# Patient Record
Sex: Female | Born: 1955 | Race: White | Hispanic: No | Marital: Married | Smoking: Current every day smoker
Health system: Southern US, Community
[De-identification: ages and names within clinical notes are randomized; demographics above are authoritative.]

## PROBLEM LIST (undated history)

## (undated) DIAGNOSIS — H9319 Tinnitus, unspecified ear: Secondary | ICD-10-CM

## (undated) DIAGNOSIS — R42 Dizziness and giddiness: Secondary | ICD-10-CM

## (undated) HISTORY — DX: Tinnitus, unspecified ear: H93.19

## (undated) HISTORY — DX: Dizziness and giddiness: R42

---

## 2011-11-25 ENCOUNTER — Ambulatory Visit (INDEPENDENT_AMBULATORY_CARE_PROVIDER_SITE_OTHER): Payer: 59 | Admitting: Physician Assistant

## 2011-11-25 VITALS — BP 100/68 | HR 76 | Temp 98.4°F | Resp 16 | Ht 62.5 in | Wt 143.2 lb

## 2011-11-25 DIAGNOSIS — R350 Frequency of micturition: Secondary | ICD-10-CM

## 2011-11-25 LAB — POCT URINALYSIS DIPSTICK
Blood, UA: NEGATIVE
Glucose, UA: NEGATIVE
Nitrite, UA: NEGATIVE
Urobilinogen, UA: 0.2

## 2011-11-25 LAB — POCT UA - MICROSCOPIC ONLY
Bacteria, U Microscopic: NEGATIVE
Casts, Ur, LPF, POC: NEGATIVE
Epithelial cells, urine per micros: NEGATIVE
Yeast, UA: NEGATIVE

## 2011-11-25 MED ORDER — CIPROFLOXACIN HCL 500 MG PO TABS: 500.0000 mg | ORAL_TABLET | Freq: Two times a day (BID) | ORAL | Status: AC
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
## 2011-11-27 LAB — URINE CULTURE: Colony Count: 8000

## 2011-11-28 ENCOUNTER — Telehealth: Payer: Self-pay

## 2011-11-28 NOTE — Telephone Encounter (Signed)
Please contact pt about her lab.Marland Kitchen

## 2011-11-28 NOTE — Telephone Encounter (Signed)
See labs 

## 2012-01-25 ENCOUNTER — Other Ambulatory Visit (HOSPITAL_COMMUNITY)
Admission: RE | Admit: 2012-01-25 | Discharge: 2012-01-25 | Disposition: A | Discharge status: Home / Self Care | Payer: 59 | Source: Ambulatory Visit | Attending: Family Medicine | Admitting: Family Medicine

## 2012-01-25 DIAGNOSIS — Z0000 Encounter for general adult medical examination without abnormal findings: Secondary | ICD-10-CM | POA: Insufficient documentation

## 2012-11-13 ENCOUNTER — Other Ambulatory Visit: Payer: Self-pay

## 2012-11-13 DIAGNOSIS — Z1231 Encounter for screening mammogram for malignant neoplasm of breast: Secondary | ICD-10-CM

## 2012-12-05 ENCOUNTER — Ambulatory Visit
Admission: RE | Admit: 2012-12-05 | Discharge: 2012-12-05 | Disposition: A | Discharge status: Home / Self Care | Payer: 59 | Source: Ambulatory Visit

## 2012-12-05 DIAGNOSIS — Z1231 Encounter for screening mammogram for malignant neoplasm of breast: Secondary | ICD-10-CM

## 2014-01-22 ENCOUNTER — Other Ambulatory Visit: Payer: Self-pay

## 2014-01-22 DIAGNOSIS — Z1231 Encounter for screening mammogram for malignant neoplasm of breast: Secondary | ICD-10-CM

## 2014-01-30 ENCOUNTER — Ambulatory Visit
Admission: RE | Admit: 2014-01-30 | Discharge: 2014-01-30 | Disposition: A | Discharge status: Home / Self Care | Payer: 59 | Source: Ambulatory Visit

## 2014-01-30 DIAGNOSIS — Z1231 Encounter for screening mammogram for malignant neoplasm of breast: Secondary | ICD-10-CM

## 2015-07-01 ENCOUNTER — Other Ambulatory Visit: Payer: Self-pay | Admitting: Family Medicine

## 2015-07-01 DIAGNOSIS — G4489 Other headache syndrome: Secondary | ICD-10-CM

## 2015-07-14 ENCOUNTER — Ambulatory Visit
Admission: RE | Admit: 2015-07-14 | Discharge: 2015-07-14 | Disposition: A | Discharge status: Home / Self Care | Payer: Self-pay | Source: Ambulatory Visit | Attending: Family Medicine | Admitting: Family Medicine

## 2015-07-14 DIAGNOSIS — G4489 Other headache syndrome: Secondary | ICD-10-CM

## 2015-07-14 MED ORDER — GADOBENATE DIMEGLUMINE 529 MG/ML IV SOLN
14.0000 mL | Freq: Once | INTRAVENOUS | Status: AC | PRN
  Administered 2015-07-14: 14 mL via INTRAVENOUS

## 2016-05-31 DIAGNOSIS — R05 Cough: Secondary | ICD-10-CM | POA: Diagnosis not present

## 2016-05-31 DIAGNOSIS — R112 Nausea with vomiting, unspecified: Secondary | ICD-10-CM | POA: Diagnosis not present

## 2016-05-31 DIAGNOSIS — R197 Diarrhea, unspecified: Secondary | ICD-10-CM | POA: Diagnosis not present

## 2016-06-20 ENCOUNTER — Encounter: Payer: Self-pay | Admitting: Pediatrics

## 2016-06-20 ENCOUNTER — Ambulatory Visit (INDEPENDENT_AMBULATORY_CARE_PROVIDER_SITE_OTHER): Payer: 59 | Admitting: Pediatrics

## 2016-06-20 VITALS — BP 118/66 | HR 69 | Temp 98.0°F | Resp 16 | Ht 62.5 in | Wt 143.0 lb

## 2016-06-20 DIAGNOSIS — J4521 Mild intermittent asthma with (acute) exacerbation: Secondary | ICD-10-CM

## 2016-06-20 DIAGNOSIS — B9789 Other viral agents as the cause of diseases classified elsewhere: Secondary | ICD-10-CM

## 2016-06-20 DIAGNOSIS — J069 Acute upper respiratory infection, unspecified: Secondary | ICD-10-CM | POA: Insufficient documentation

## 2016-06-20 DIAGNOSIS — J4520 Mild intermittent asthma, uncomplicated: Secondary | ICD-10-CM | POA: Insufficient documentation

## 2016-06-20 DIAGNOSIS — J3089 Other allergic rhinitis: Secondary | ICD-10-CM | POA: Insufficient documentation

## 2016-06-20 MED ORDER — ALBUTEROL SULFATE HFA 108 (90 BASE) MCG/ACT IN AERS: 2.0000 | INHALATION_SPRAY | RESPIRATORY_TRACT | 2 refills | Status: AC | PRN

## 2016-06-20 NOTE — Progress Notes (Signed)
  [address] [place] [address] Dept: [phone]  FOLLOW UP NOTE  Patient ID: Kari Doyle, female    DOB: [birth year]  Age: 61 y.o. MRN: [id number] Date of Office Visit: 06/20/2016  Assessment  Chief Complaint: Asthma  HPI Kari Simmerslicia Anne Canale presents for follow-up of a asthma and allergic rhinitis. We had not seen her since October 2015. Her asthma has been well controlled without preventative medications. She developed a respiratory infection in February 10, and  began to have coughing and wheezing. She had a low-grade fever for a day or 2. She has continued to cough. She began to use Ventolin 2 puffs every 4 hours if needed.. She did not get a flu shot last fall. If she eats peanut butter she gets abdominal gas only.  Current medications are Zyrtec 10 mg once a day if needed for runny nose and Ventolin 2 puffs every 4 hours if needed for wheezing or coughing spells. Her other medications are outlined in the chart   Drug Allergies:  Allergies  Allergen Reactions  . Aspirin   . Nsaids     Physical Exam: BP 118/66   Pulse 69   Temp 98 F (36.7 C) (Oral)   Resp 16   Ht 5' 2.5" (1.588 m)   Wt 143 lb (64.9 kg)   SpO2 96%   BMI 25.74 kg/m    Physical Exam  Constitutional: She appears well-developed and well-nourished.  HENT:  Eyes normal. Ears normal. Nose normal. Pharynx normal.  Neck: Neck supple.  Cardiovascular:  S1 and S2 normal no murmurs  Pulmonary/Chest:  Clear to percussion auscultation except for mild  wheezing on end expiration  Lymphadenopathy:    She has no cervical adenopathy.  Neurological: She is alert.  Psychiatric: She has a normal mood and affect. Her behavior is normal. Judgment and thought content normal.  Vitals reviewed.   Diagnostics: FVC 2.36 L FEV1 2.03 L. Predicted FVC 2.99 L predicted FEV1 2.30 L-the spirometry is in the normal range   Assessment and Plan: 1. Mild intermittent asthma with acute exacerbation   2. Other  allergic rhinitis   3. Viral upper respiratory tract infection with cough     Meds ordered this encounter  Medications  . albuterol (PROVENTIL HFA;VENTOLIN HFA) 108 (90 Base) MCG/ACT inhaler    Sig: Inhale 2 puffs into the lungs every 4 (four) hours as needed.    Dispense:  1 Inhaler    Refill:  2    Patient Instructions  Ventolin 2 puffs every 4 hours if needed for wheezing or coughing spells Add prednisone 20 mg twice a day for 3 days, 20 mg on the fourth day, 10 mg on the fifth day to bring  the asthma under control. She should have a flu shot this fall She will call me if she's not doing well on this treatment plan   Return in about 1 year (around 06/20/2017).    Thank you for the opportunity to care for this patient.  Please do not hesitate to contact me with questions.  Tonette BihariJ. A. Bardelas, M.D.  [facility]  [address] [place], [place] [address] [phone]

## 2016-06-20 NOTE — Patient Instructions (Signed)
Ventolin 2 puffs every 4 hours if needed for wheezing or coughing spells Add prednisone 20 mg twice a day for 3 days, 20 mg on the fourth day, 10 mg on the fifth day to bring  the asthma under control. She should have a flu shot this fall She will call me if she's not doing well on this treatment plan

## 2016-06-28 ENCOUNTER — Ambulatory Visit (INDEPENDENT_AMBULATORY_CARE_PROVIDER_SITE_OTHER): Payer: 59 | Admitting: Pediatrics

## 2016-06-28 ENCOUNTER — Encounter: Payer: Self-pay | Admitting: Pediatrics

## 2016-06-28 VITALS — BP 92/64 | HR 70 | Temp 98.3°F | Resp 16

## 2016-06-28 DIAGNOSIS — J4520 Mild intermittent asthma, uncomplicated: Secondary | ICD-10-CM

## 2016-06-28 DIAGNOSIS — L503 Dermatographic urticaria: Secondary | ICD-10-CM | POA: Diagnosis not present

## 2016-06-28 DIAGNOSIS — J3089 Other allergic rhinitis: Secondary | ICD-10-CM

## 2016-06-28 NOTE — Patient Instructions (Addendum)
Environmental control of dust and mold Zyrtec 10 mg once a day if needed for runny nose or itching Fluticasone 2 sprays per nostril once a day if needed for stuffy nose Ventolin 2 puffs every 4 hours if needed for wheezing or coughing spells She should have a flu shot this fall Keep the dog out of her bedroom Continue avoiding aspirin, ibuprofen and related compounds The foods that she has been avoiding, may have been at a time when she had very significant dermographia. The skin testing to these foods was negative. She  may add these foods individually to see if they still make her itch  Call me if you're not doing well on this treatment plan

## 2016-06-28 NOTE — Progress Notes (Signed)
  [address] [place] [address] Dept: [phone]  FOLLOW UP NOTE  Patient ID: Kari Doyle Anne Doyle, female    DOB: 1955-08-20  Age: 61 y.o. MRN: [id number] Date of Office Visit: 06/28/2016  Assessment  Chief Complaint: Allergy Testing  HPI Kari Doyle Anne Pellum presents for allergy testing. She had an exacerbation of asthma in  February associated with a  respiratory tract infection . 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 She has had itching in the past from strawberries, apples, peaches, vinegar. At times she does have nasal congestion. She avoids aspirin ibuprofen and related compounds. She has a history of thyroid antibodies when she had dermographia. If she eats a great deal of peanut butter she has some gas , but no allergic symptoms   Drug Allergies:  Allergies  Allergen Reactions  . Aspirin   . Nsaids     Physical Exam: BP 92/64   Pulse 70   Temp 98.3 F (36.8 C) (Oral)   Resp 16   SpO2 99%    Physical Exam  Constitutional: She is oriented to person, place, and time. She appears well-developed and well-nourished.  HENT:  Eyes normal. Ears normal. Nose normal. Pharynx normal.  Neck: Neck supple.  Cardiovascular:  S1 and S2 normal no murmurs  Pulmonary/Chest:  Clear to percussion and auscultation  Lymphadenopathy:    She has no cervical adenopathy.  Neurological: She is alert and oriented to person, place, and time.  Psychiatric: She has a normal mood and affect. Her behavior is normal. Judgment and thought content normal.  Vitals reviewed.   Diagnostics:  Add allergy skin testing showed positive skin test to a common indoor mold. Skin testing to dog showed minimal reactivity on intradermal testing only. Skin testing to foods that she has been avoiding in the past was negative  FVC 2.56 L FEV1 2.06 L. Predicted FVC 3.13 L predicted FEV1 2.41 L-the spirometry is in the normal range  Assessment and Plan: 1. Other allergic rhinitis   2. Mild  intermittent asthma without complication   3. Dermographia        Patient Instructions  Environmental control of dust and mold Zyrtec 10 mg once a day if needed for runny nose or itching Fluticasone 2 sprays per nostril once a day if needed for stuffy nose Ventolin 2 puffs every 4 hours if needed for wheezing or coughing spells She should have a flu shot this fall Keep the dog out of her bedroom Continue avoiding aspirin, ibuprofen and related compounds The foods that she has been avoiding, may have been at a time when she had very significant dermographia. The skin testing to these foods was negative. She  may add these foods individually to see if they still make her itch  Call me if you're not doing well on this treatment plan   Return in about 6 months (around 12/26/2016).    Thank you for the opportunity to care for this patient.  Please do not hesitate to contact me with questions.  Tonette BihariJ. A. Tiauna Whisnant, M.D.  [facility] [address] [place], [place] [address] [phone]

## 2016-07-04 NOTE — Addendum Note (Signed)
Addended by: Maryjean MornFREEMAN, LOGAN D on: 07/04/2016 02:03 PM   Modules accepted: Orders

## 2017-01-10 DIAGNOSIS — Z23 Encounter for immunization: Secondary | ICD-10-CM | POA: Diagnosis not present

## 2017-01-10 DIAGNOSIS — H9311 Tinnitus, right ear: Secondary | ICD-10-CM | POA: Diagnosis not present

## 2017-01-27 DIAGNOSIS — H9311 Tinnitus, right ear: Secondary | ICD-10-CM | POA: Diagnosis not present

## 2017-01-27 DIAGNOSIS — J343 Hypertrophy of nasal turbinates: Secondary | ICD-10-CM | POA: Diagnosis not present

## 2017-01-27 DIAGNOSIS — H903 Sensorineural hearing loss, bilateral: Secondary | ICD-10-CM | POA: Diagnosis not present

## 2017-01-27 DIAGNOSIS — H6981 Other specified disorders of Eustachian tube, right ear: Secondary | ICD-10-CM | POA: Diagnosis not present

## 2017-07-26 ENCOUNTER — Other Ambulatory Visit: Payer: Self-pay | Admitting: Family Medicine

## 2017-07-26 ENCOUNTER — Other Ambulatory Visit (HOSPITAL_COMMUNITY)
Admission: RE | Admit: 2017-07-26 | Discharge: 2017-07-26 | Disposition: A | Discharge status: Home / Self Care | Payer: 59 | Source: Ambulatory Visit | Attending: Family Medicine | Admitting: Family Medicine

## 2017-07-26 DIAGNOSIS — R1111 Vomiting without nausea: Secondary | ICD-10-CM | POA: Diagnosis not present

## 2017-07-26 DIAGNOSIS — E785 Hyperlipidemia, unspecified: Secondary | ICD-10-CM | POA: Diagnosis not present

## 2017-07-26 DIAGNOSIS — Z0000 Encounter for general adult medical examination without abnormal findings: Secondary | ICD-10-CM | POA: Diagnosis not present

## 2017-07-26 DIAGNOSIS — Z124 Encounter for screening for malignant neoplasm of cervix: Secondary | ICD-10-CM | POA: Diagnosis present

## 2017-07-26 DIAGNOSIS — R350 Frequency of micturition: Secondary | ICD-10-CM | POA: Diagnosis not present

## 2017-07-28 LAB — CYTOLOGY - PAP
Diagnosis: NEGATIVE
HPV: NOT DETECTED

## 2017-08-09 ENCOUNTER — Other Ambulatory Visit: Payer: Self-pay | Admitting: Gastroenterology

## 2017-08-09 DIAGNOSIS — R1110 Vomiting, unspecified: Secondary | ICD-10-CM

## 2017-08-09 DIAGNOSIS — R198 Other specified symptoms and signs involving the digestive system and abdomen: Secondary | ICD-10-CM | POA: Diagnosis not present

## 2017-08-14 ENCOUNTER — Ambulatory Visit
Admission: RE | Admit: 2017-08-14 | Discharge: 2017-08-14 | Disposition: A | Discharge status: Home / Self Care | Payer: 59 | Source: Ambulatory Visit | Attending: Gastroenterology | Admitting: Gastroenterology

## 2017-08-14 ENCOUNTER — Other Ambulatory Visit: Payer: Self-pay | Admitting: Gastroenterology

## 2017-08-14 DIAGNOSIS — R1110 Vomiting, unspecified: Secondary | ICD-10-CM

## 2017-08-21 ENCOUNTER — Other Ambulatory Visit: Payer: Self-pay | Admitting: Family Medicine

## 2017-08-21 DIAGNOSIS — Z139 Encounter for screening, unspecified: Secondary | ICD-10-CM

## 2017-08-23 ENCOUNTER — Ambulatory Visit
Admission: RE | Admit: 2017-08-23 | Discharge: 2017-08-23 | Disposition: A | Discharge status: Home / Self Care | Payer: 59 | Source: Ambulatory Visit | Attending: Gastroenterology | Admitting: Gastroenterology

## 2017-08-23 DIAGNOSIS — K449 Diaphragmatic hernia without obstruction or gangrene: Secondary | ICD-10-CM | POA: Diagnosis not present

## 2017-08-23 DIAGNOSIS — R1110 Vomiting, unspecified: Secondary | ICD-10-CM

## 2017-08-25 ENCOUNTER — Other Ambulatory Visit: Payer: Self-pay | Admitting: Gastroenterology

## 2017-08-25 DIAGNOSIS — R933 Abnormal findings on diagnostic imaging of other parts of digestive tract: Secondary | ICD-10-CM

## 2017-09-05 ENCOUNTER — Ambulatory Visit
Admission: RE | Admit: 2017-09-05 | Discharge: 2017-09-05 | Disposition: A | Discharge status: Home / Self Care | Payer: 59 | Source: Ambulatory Visit | Attending: Gastroenterology | Admitting: Gastroenterology

## 2017-09-05 DIAGNOSIS — R933 Abnormal findings on diagnostic imaging of other parts of digestive tract: Secondary | ICD-10-CM

## 2017-09-05 DIAGNOSIS — K7689 Other specified diseases of liver: Secondary | ICD-10-CM | POA: Diagnosis not present

## 2017-09-12 DIAGNOSIS — K449 Diaphragmatic hernia without obstruction or gangrene: Secondary | ICD-10-CM | POA: Diagnosis not present

## 2017-09-12 DIAGNOSIS — K760 Fatty (change of) liver, not elsewhere classified: Secondary | ICD-10-CM | POA: Diagnosis not present

## 2017-09-13 DIAGNOSIS — M8588 Other specified disorders of bone density and structure, other site: Secondary | ICD-10-CM | POA: Diagnosis not present

## 2017-09-19 ENCOUNTER — Ambulatory Visit
Admission: RE | Admit: 2017-09-19 | Discharge: 2017-09-19 | Disposition: A | Discharge status: Home / Self Care | Payer: 59 | Source: Ambulatory Visit | Attending: Family Medicine | Admitting: Family Medicine

## 2017-09-19 DIAGNOSIS — Z139 Encounter for screening, unspecified: Secondary | ICD-10-CM

## 2017-09-19 DIAGNOSIS — Z1231 Encounter for screening mammogram for malignant neoplasm of breast: Secondary | ICD-10-CM | POA: Diagnosis not present

## 2017-09-20 ENCOUNTER — Other Ambulatory Visit: Payer: Self-pay | Admitting: Family Medicine

## 2017-09-20 DIAGNOSIS — R921 Mammographic calcification found on diagnostic imaging of breast: Secondary | ICD-10-CM

## 2017-09-25 ENCOUNTER — Other Ambulatory Visit: Payer: Self-pay | Admitting: Family Medicine

## 2017-09-25 ENCOUNTER — Ambulatory Visit
Admission: RE | Admit: 2017-09-25 | Discharge: 2017-09-25 | Disposition: A | Discharge status: Home / Self Care | Payer: 59 | Source: Ambulatory Visit | Attending: Family Medicine | Admitting: Family Medicine

## 2017-09-25 DIAGNOSIS — R921 Mammographic calcification found on diagnostic imaging of breast: Secondary | ICD-10-CM

## 2017-09-25 DIAGNOSIS — R928 Other abnormal and inconclusive findings on diagnostic imaging of breast: Secondary | ICD-10-CM | POA: Diagnosis not present

## 2017-09-27 ENCOUNTER — Ambulatory Visit
Admission: RE | Admit: 2017-09-27 | Discharge: 2017-09-27 | Disposition: A | Discharge status: Home / Self Care | Payer: 59 | Source: Ambulatory Visit | Attending: Family Medicine | Admitting: Family Medicine

## 2017-09-27 DIAGNOSIS — N6011 Diffuse cystic mastopathy of right breast: Secondary | ICD-10-CM | POA: Diagnosis not present

## 2017-09-27 DIAGNOSIS — R921 Mammographic calcification found on diagnostic imaging of breast: Secondary | ICD-10-CM

## 2017-09-28 DIAGNOSIS — R1084 Generalized abdominal pain: Secondary | ICD-10-CM | POA: Diagnosis not present

## 2018-01-29 ENCOUNTER — Ambulatory Visit
Admission: RE | Admit: 2018-01-29 | Discharge: 2018-01-29 | Disposition: A | Discharge status: Home / Self Care | Payer: 59 | Source: Ambulatory Visit | Attending: Family Medicine | Admitting: Family Medicine

## 2018-01-29 ENCOUNTER — Other Ambulatory Visit: Payer: Self-pay | Admitting: Family Medicine

## 2018-01-29 DIAGNOSIS — R0789 Other chest pain: Secondary | ICD-10-CM | POA: Diagnosis not present

## 2018-01-29 DIAGNOSIS — Z23 Encounter for immunization: Secondary | ICD-10-CM | POA: Diagnosis not present

## 2018-01-29 DIAGNOSIS — R079 Chest pain, unspecified: Secondary | ICD-10-CM | POA: Diagnosis not present

## 2018-01-29 DIAGNOSIS — R52 Pain, unspecified: Secondary | ICD-10-CM

## 2018-02-28 DIAGNOSIS — R0781 Pleurodynia: Secondary | ICD-10-CM | POA: Diagnosis not present

## 2018-02-28 DIAGNOSIS — F17200 Nicotine dependence, unspecified, uncomplicated: Secondary | ICD-10-CM | POA: Diagnosis not present

## 2018-07-03 ENCOUNTER — Ambulatory Visit
Admission: RE | Admit: 2018-07-03 | Discharge: 2018-07-03 | Disposition: A | Discharge status: Home / Self Care | Payer: 59 | Source: Ambulatory Visit | Attending: Family Medicine | Admitting: Family Medicine

## 2018-07-03 ENCOUNTER — Other Ambulatory Visit: Payer: Self-pay | Admitting: Family Medicine

## 2018-07-03 DIAGNOSIS — R0789 Other chest pain: Secondary | ICD-10-CM | POA: Diagnosis not present

## 2018-07-03 DIAGNOSIS — R52 Pain, unspecified: Secondary | ICD-10-CM

## 2018-07-03 DIAGNOSIS — F17200 Nicotine dependence, unspecified, uncomplicated: Secondary | ICD-10-CM | POA: Diagnosis not present

## 2018-08-02 DIAGNOSIS — J341 Cyst and mucocele of nose and nasal sinus: Secondary | ICD-10-CM | POA: Diagnosis not present

## 2018-08-02 DIAGNOSIS — G44219 Episodic tension-type headache, not intractable: Secondary | ICD-10-CM | POA: Diagnosis not present

## 2018-08-02 DIAGNOSIS — H9201 Otalgia, right ear: Secondary | ICD-10-CM | POA: Diagnosis not present

## 2018-11-02 ENCOUNTER — Ambulatory Visit: Payer: 59

## 2018-11-02 ENCOUNTER — Other Ambulatory Visit: Payer: Self-pay | Admitting: Family Medicine

## 2018-11-02 DIAGNOSIS — Z1231 Encounter for screening mammogram for malignant neoplasm of breast: Secondary | ICD-10-CM

## 2018-11-05 ENCOUNTER — Other Ambulatory Visit: Payer: Self-pay

## 2018-11-05 ENCOUNTER — Ambulatory Visit
Admission: RE | Admit: 2018-11-05 | Discharge: 2018-11-05 | Disposition: A | Discharge status: Home / Self Care | Payer: 59 | Source: Ambulatory Visit | Attending: Family Medicine | Admitting: Family Medicine

## 2018-11-05 DIAGNOSIS — Z1231 Encounter for screening mammogram for malignant neoplasm of breast: Secondary | ICD-10-CM

## 2019-03-11 ENCOUNTER — Other Ambulatory Visit: Payer: Self-pay | Admitting: Family Medicine

## 2019-03-11 DIAGNOSIS — R634 Abnormal weight loss: Secondary | ICD-10-CM

## 2019-03-27 ENCOUNTER — Ambulatory Visit
Admission: RE | Admit: 2019-03-27 | Discharge: 2019-03-27 | Disposition: A | Discharge status: Home / Self Care | Payer: Medicare Other | Source: Ambulatory Visit | Attending: Family Medicine | Admitting: Family Medicine

## 2019-03-27 ENCOUNTER — Other Ambulatory Visit: Payer: Self-pay

## 2019-03-27 ENCOUNTER — Ambulatory Visit
Admission: RE | Admit: 2019-03-27 | Discharge: 2019-03-27 | Disposition: A | Discharge status: Home / Self Care | Payer: Self-pay | Source: Ambulatory Visit | Attending: Family Medicine | Admitting: Family Medicine

## 2019-03-27 DIAGNOSIS — R634 Abnormal weight loss: Secondary | ICD-10-CM

## 2019-03-27 MED ORDER — IOPAMIDOL (ISOVUE-300) INJECTION 61%
100.0000 mL | Freq: Once | INTRAVENOUS | Status: AC | PRN
  Administered 2019-03-27: 100 mL via INTRAVENOUS

## 2019-04-12 IMAGING — CR DG CHEST 2V
2 series · 2 of 2 positions shown · non-contrast
Comparison: None.

CLINICAL DATA: Chest pain for 2 months, RIGHT greater than LEFT.

EXAM:
CHEST - 2 VIEW

[w chest pa]
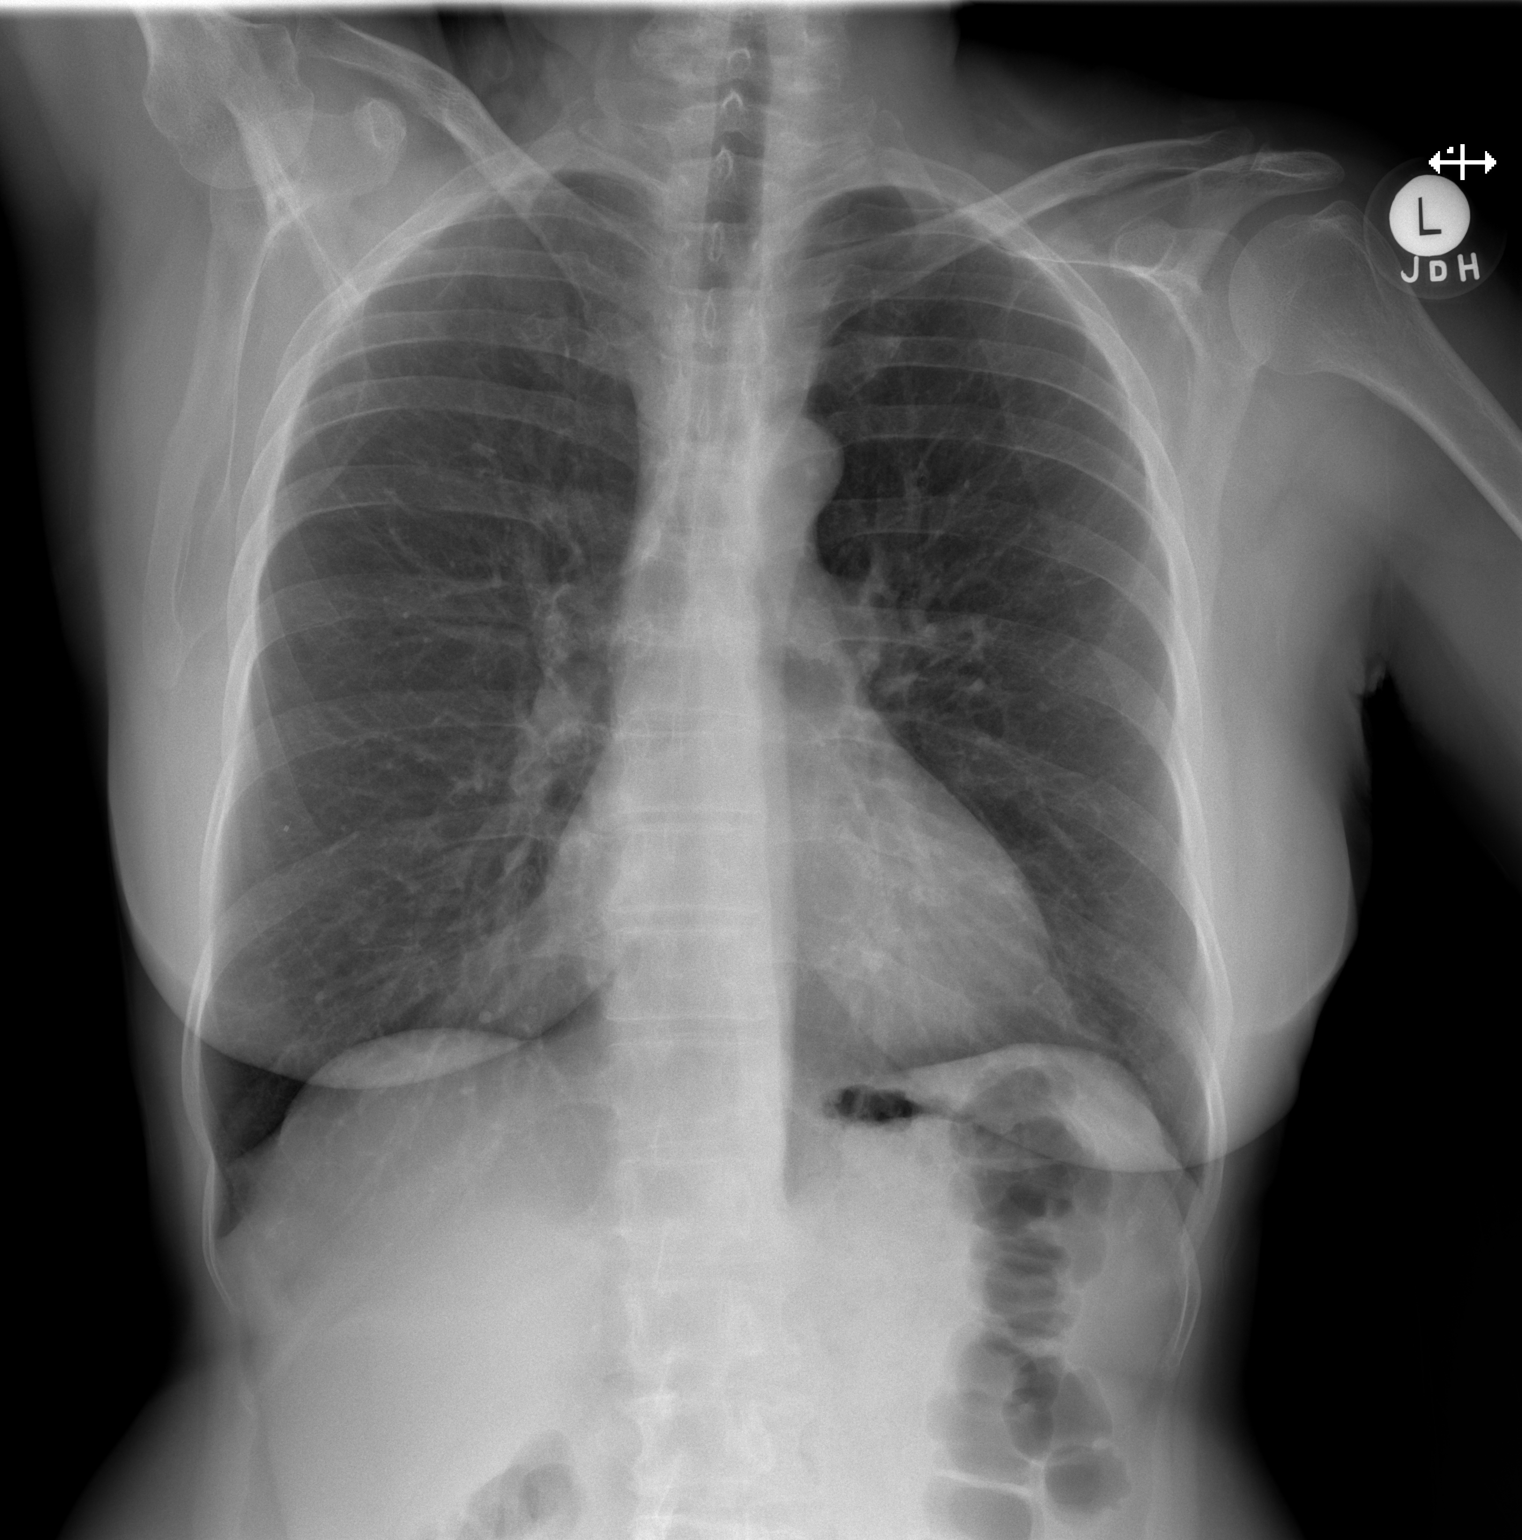

[w chest lat]
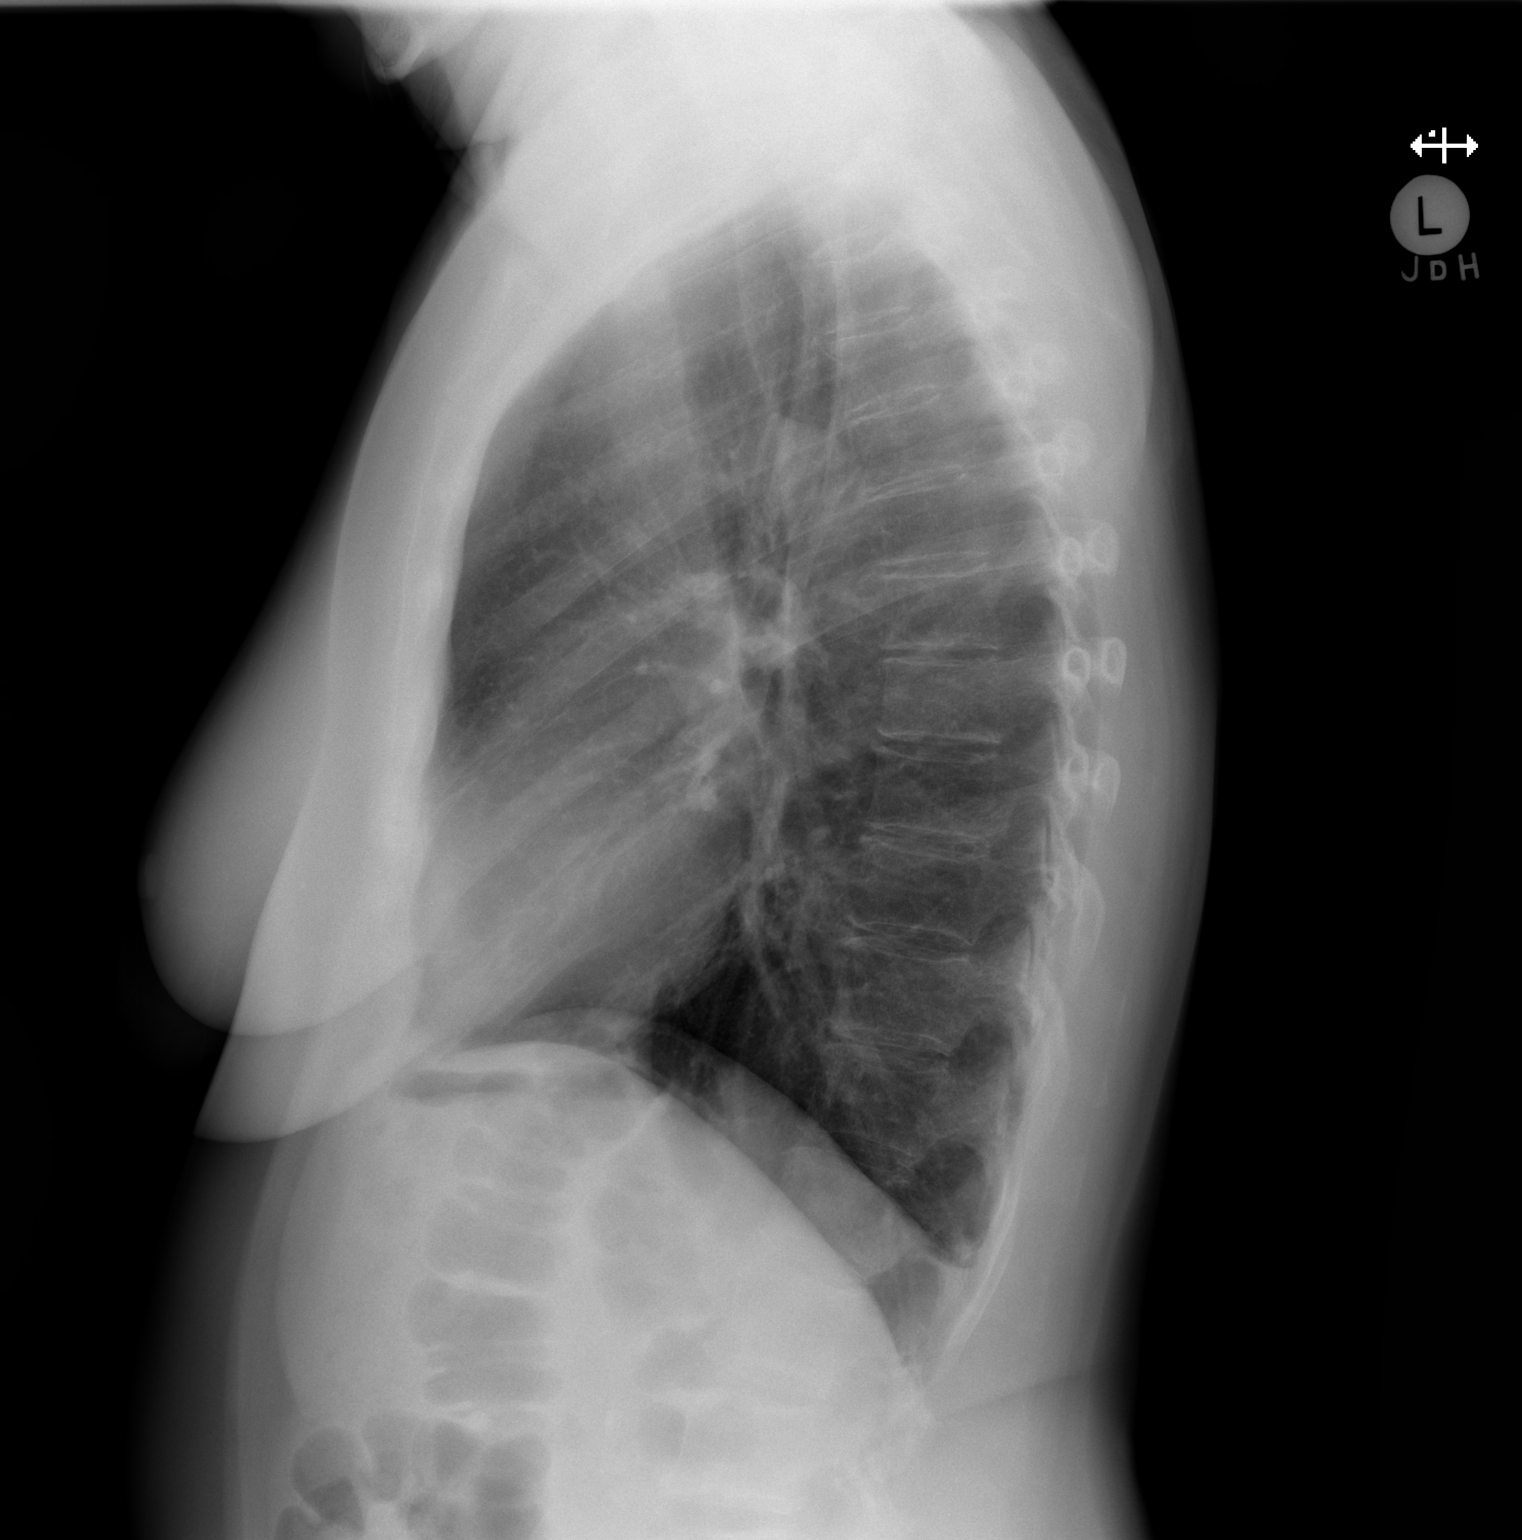

[2 of 2 positions shown; findings below may reference images not displayed]

FINDINGS: Heart size and mediastinal contours are within normal limits. Lungs
are clear. No pleural effusion or pneumothorax seen. Osseous
structures about the chest are unremarkable.
IMPRESSION: No active cardiopulmonary disease.

## 2019-08-30 ENCOUNTER — Other Ambulatory Visit: Payer: Self-pay | Admitting: Family Medicine

## 2019-08-30 DIAGNOSIS — Z1231 Encounter for screening mammogram for malignant neoplasm of breast: Secondary | ICD-10-CM

## 2019-08-30 DIAGNOSIS — M8580 Other specified disorders of bone density and structure, unspecified site: Secondary | ICD-10-CM

## 2020-01-24 ENCOUNTER — Other Ambulatory Visit: Payer: Self-pay

## 2020-01-24 ENCOUNTER — Ambulatory Visit
Admission: RE | Admit: 2020-01-24 | Discharge: 2020-01-24 | Disposition: A | Discharge status: Home / Self Care | Payer: Medicare Other | Source: Ambulatory Visit | Attending: Family Medicine | Admitting: Family Medicine

## 2020-01-24 DIAGNOSIS — Z1231 Encounter for screening mammogram for malignant neoplasm of breast: Secondary | ICD-10-CM

## 2020-01-29 ENCOUNTER — Other Ambulatory Visit: Payer: Self-pay | Admitting: Family Medicine

## 2020-01-29 DIAGNOSIS — R928 Other abnormal and inconclusive findings on diagnostic imaging of breast: Secondary | ICD-10-CM

## 2020-02-11 ENCOUNTER — Ambulatory Visit: Payer: Medicare Other

## 2020-02-11 ENCOUNTER — Other Ambulatory Visit: Payer: Self-pay

## 2020-02-11 ENCOUNTER — Ambulatory Visit
Admission: RE | Admit: 2020-02-11 | Discharge: 2020-02-11 | Disposition: A | Discharge status: Home / Self Care | Payer: Medicare Other | Source: Ambulatory Visit | Attending: Family Medicine | Admitting: Family Medicine

## 2020-02-11 DIAGNOSIS — R928 Other abnormal and inconclusive findings on diagnostic imaging of breast: Secondary | ICD-10-CM

## 2020-03-30 ENCOUNTER — Other Ambulatory Visit: Payer: Self-pay | Admitting: Family Medicine

## 2020-03-30 DIAGNOSIS — R911 Solitary pulmonary nodule: Secondary | ICD-10-CM

## 2020-04-10 ENCOUNTER — Other Ambulatory Visit: Payer: Self-pay

## 2020-04-10 ENCOUNTER — Ambulatory Visit
Admission: RE | Admit: 2020-04-10 | Discharge: 2020-04-10 | Disposition: A | Discharge status: Home / Self Care | Payer: Medicare Other | Source: Ambulatory Visit | Attending: Family Medicine | Admitting: Family Medicine

## 2020-04-10 DIAGNOSIS — R911 Solitary pulmonary nodule: Secondary | ICD-10-CM

## 2020-04-22 ENCOUNTER — Ambulatory Visit
Admission: RE | Admit: 2020-04-22 | Discharge: 2020-04-22 | Disposition: A | Discharge status: Home / Self Care | Payer: Medicare Other | Source: Ambulatory Visit | Attending: Family Medicine | Admitting: Family Medicine

## 2020-04-22 ENCOUNTER — Other Ambulatory Visit: Payer: Self-pay

## 2020-04-22 DIAGNOSIS — M8580 Other specified disorders of bone density and structure, unspecified site: Secondary | ICD-10-CM

## 2021-04-07 ENCOUNTER — Other Ambulatory Visit: Payer: Self-pay | Admitting: Family Medicine

## 2021-04-07 DIAGNOSIS — Z1231 Encounter for screening mammogram for malignant neoplasm of breast: Secondary | ICD-10-CM

## 2021-05-12 ENCOUNTER — Other Ambulatory Visit: Payer: Self-pay

## 2021-05-12 ENCOUNTER — Ambulatory Visit
Admission: RE | Admit: 2021-05-12 | Discharge: 2021-05-12 | Disposition: A | Discharge status: Home / Self Care | Payer: Medicare Other | Source: Ambulatory Visit | Attending: Family Medicine | Admitting: Family Medicine

## 2021-05-12 DIAGNOSIS — Z1231 Encounter for screening mammogram for malignant neoplasm of breast: Secondary | ICD-10-CM

## 2022-03-24 ENCOUNTER — Other Ambulatory Visit: Payer: Self-pay

## 2022-03-24 DIAGNOSIS — F17210 Nicotine dependence, cigarettes, uncomplicated: Secondary | ICD-10-CM

## 2022-03-24 DIAGNOSIS — Z87891 Personal history of nicotine dependence: Secondary | ICD-10-CM

## 2022-03-24 DIAGNOSIS — Z122 Encounter for screening for malignant neoplasm of respiratory organs: Secondary | ICD-10-CM

## 2022-05-11 ENCOUNTER — Ambulatory Visit (INDEPENDENT_AMBULATORY_CARE_PROVIDER_SITE_OTHER): Payer: Medicare Other | Admitting: Acute Care

## 2022-05-11 ENCOUNTER — Encounter: Payer: Self-pay | Admitting: Acute Care

## 2022-05-11 DIAGNOSIS — F17210 Nicotine dependence, cigarettes, uncomplicated: Secondary | ICD-10-CM

## 2022-05-11 NOTE — Progress Notes (Addendum)
Virtual Visit via Telephone Note  I connected with Kari Doyle on 05/11/22 at 10:30 AM EST by telephone and verified that I am speaking with the correct person using two identifiers.  Location: Patient:  At home Provider:  [address], [place], [place], [address]    I discussed the limitations, risks, security and privacy concerns of performing an evaluation and management service by telephone and the availability of in person appointments. I also discussed with the patient that there may be a patient responsible charge related to this service. The patient expressed understanding and agreed to proceed.   Shared Decision Making Visit Lung Cancer Screening Program [phone])   Eligibility: Age 66 y.o. Pack Years Smoking History Calculation 47 pack year smoking history (# packs/per year x # years smoked) Recent History of coughing up blood  no Unexplained weight loss? no ( >Than 15 pounds within the last 6 months ) Prior History Lung / other cancer no (Diagnosis within the last 5 years already requiring surveillance chest CT Scans). Smoking Status Current Smoker Former Smokers: Years since quit:  NA  Quit Date:  NA  Visit Components: Discussion included one or more decision making aids. yes Discussion included risk/benefits of screening. yes Discussion included potential follow up diagnostic testing for abnormal scans. yes Discussion included meaning and risk of over diagnosis. yes Discussion included meaning and risk of False Positives. yes Discussion included meaning of total radiation exposure. yes  Counseling Included: Importance of adherence to annual lung cancer LDCT screening. yes Impact of comorbidities on ability to participate in the program. yes Ability and willingness to under diagnostic treatment. yes  Smoking Cessation Counseling: Current Smokers:  Discussed importance of smoking cessation. yes Information about tobacco cessation classes and  interventions provided to patient. yes Patient provided with "ticket" for LDCT Scan. yes Symptomatic Patient. no  Counseling NA Diagnosis Code: Tobacco Use Z72.0 Asymptomatic Patient yes  Counseling (Intermediate counseling: > three minutes counseling) U4403 Former Smokers:  Discussed the importance of maintaining cigarette abstinence. yes Diagnosis Code: Personal History of Nicotine Dependence. K74.259 Information about tobacco cessation classes and interventions provided to patient. Yes Patient provided with "ticket" for LDCT Scan. yes Written Order for Lung Cancer Screening with LDCT placed in Epic. Yes (CT Chest Lung Cancer Screening Low Dose W/O CM) [id number] Z12.2-Screening of respiratory organs Z87.891-Personal history of nicotine dependence  I have spent 25 minutes of face to face/ virtual visit   time with  [name] discussing the risks and benefits of lung cancer screening. We viewed / discussed a power point together that explained in detail the above noted topics. We paused at intervals to allow for questions to be asked and answered to ensure understanding.We discussed that the single most powerful action that she can take to decrease her risk of developing lung cancer is to quit smoking. We discussed whether or not she is ready to commit to setting a quit date. We discussed options for tools to aid in quitting smoking including nicotine replacement therapy, non-nicotine medications, support groups, Quit Smart classes, and behavior modification. We discussed that often times setting smaller, more achievable goals, such as eliminating 1 cigarette a day for a week and then 2 cigarettes a day for a week can be helpful in slowly decreasing the number of cigarettes smoked. This allows for a sense of accomplishment as well as providing a clinical benefit. I provided  her  with smoking cessation  information  with contact information for community resources, classes,  free nicotine replacement  therapy, and access to mobile apps, text messaging, and on-line smoking cessation help. I have also provided  her  the office contact information in the event she needs to contact me, or the screening staff. We discussed the time and location of the scan, and that either Doroteo Glassman RN, Joella Prince, RN  or I will call / send a letter with the results within 24-72 hours of receiving them. The patient verbalized understanding of all of  the above and had no further questions upon leaving the office. They have my contact information in the event they have any further questions.  I spent 3 minutes counseling on smoking cessation and the health risks of continued tobacco abuse.  I explained to the patient that there has been a high incidence of coronary artery disease noted on these exams. I explained that this is a non-gated exam therefore degree or severity cannot be determined. This patient is on statin therapy. I have asked the patient to follow-up with their PCP regarding any incidental finding of coronary artery disease and management with diet or medication as their PCP  feels is clinically indicated. The patient verbalized understanding of the above and had no further questions upon completion of the visit.     Magdalen Spatz, NP 05/11/2022

## 2022-05-11 NOTE — Patient Instructions (Signed)
Thank you for participating in the [facility]. It was our pleasure to meet you today. We will call you with the results of your scan within the next few days. Your scan will be assigned a Lung RADS category score by the physicians reading the scans.  This Lung RADS score determines follow up scanning.  See below for description of categories, and follow up screening recommendations. We will be in touch to schedule your follow up screening annually or based on recommendations of our providers. We will fax a copy of your scan results to your Primary Care Physician, or the physician who referred you to the program, to ensure they have the results. Please call the office if you have any questions or concerns regarding your scanning experience or results.  Our office number is [phone]. Please speak with Denise Phelps, RN. , or  Denise Buckner RN, They are  our Lung Cancer Screening RN.'s If They are unavailable when you call, Please leave a message on the voice mail. We will return your call at our earliest convenience.This voice mail is monitored several times a day.  Remember, if your scan is normal, we will scan you annually as long as you continue to meet the criteria for the program. (Age 55-77, Current smoker or smoker who has quit within the last 15 years). If you are a smoker, remember, quitting is the single most powerful action that you can take to decrease your risk of lung cancer and other pulmonary, breathing related problems. We know quitting is hard, and we are here to help.  Please let us know if there is anything we can do to help you meet your goal of quitting. If you are a former smoker, congratulations. We are proud of you! Remain smoke free! Remember you can refer friends or family members through the number above.  We will screen them to make sure they meet criteria for the program. Thank you for helping us take better care of you by  participating in Lung Screening.  You can receive free nicotine replacement therapy ( patches, gum or mints) by calling 1-800-QUIT NOW. Please call so we can get you on the path to becoming  a non-smoker. I know it is hard, but you can do this!  Lung RADS Categories:  Lung RADS 1: no nodules or definitely non-concerning nodules.  Recommendation is for a repeat annual scan in 12 months.  Lung RADS 2:  nodules that are non-concerning in appearance and behavior with a very low likelihood of becoming an active cancer. Recommendation is for a repeat annual scan in 12 months.  Lung RADS 3: nodules that are probably non-concerning , includes nodules with a low likelihood of becoming an active cancer.  Recommendation is for a 6-month repeat screening scan. Often noted after an upper respiratory illness. We will be in touch to make sure you have no questions, and to schedule your 6-month scan.  Lung RADS 4 A: nodules with concerning findings, recommendation is most often for a follow up scan in 3 months or additional testing based on our provider's assessment of the scan. We will be in touch to make sure you have no questions and to schedule the recommended 3 month follow up scan.  Lung RADS 4 B:  indicates findings that are concerning. We will be in touch with you to schedule additional diagnostic testing based on our provider's  assessment of the scan.  Other options for assistance in smoking cessation (   As covered by your insurance benefits)  Hypnosis for smoking cessation  [employer] [phone]  Acupuncture for smoking cessation  [facility] [phone]   

## 2022-05-12 ENCOUNTER — Ambulatory Visit
Admission: RE | Admit: 2022-05-12 | Discharge: 2022-05-12 | Disposition: A | Discharge status: Home / Self Care | Payer: Medicare Other | Source: Ambulatory Visit | Attending: Family Medicine | Admitting: Family Medicine

## 2022-05-12 DIAGNOSIS — Z87891 Personal history of nicotine dependence: Secondary | ICD-10-CM

## 2022-05-12 DIAGNOSIS — F17210 Nicotine dependence, cigarettes, uncomplicated: Secondary | ICD-10-CM

## 2022-05-12 DIAGNOSIS — Z122 Encounter for screening for malignant neoplasm of respiratory organs: Secondary | ICD-10-CM

## 2022-05-16 ENCOUNTER — Other Ambulatory Visit: Payer: Self-pay | Admitting: Acute Care

## 2022-05-16 DIAGNOSIS — F17210 Nicotine dependence, cigarettes, uncomplicated: Secondary | ICD-10-CM

## 2022-05-16 DIAGNOSIS — Z122 Encounter for screening for malignant neoplasm of respiratory organs: Secondary | ICD-10-CM

## 2022-05-16 DIAGNOSIS — Z87891 Personal history of nicotine dependence: Secondary | ICD-10-CM

## 2022-06-23 ENCOUNTER — Other Ambulatory Visit: Payer: Self-pay | Admitting: Family Medicine

## 2022-06-23 DIAGNOSIS — Z1231 Encounter for screening mammogram for malignant neoplasm of breast: Secondary | ICD-10-CM

## 2022-07-11 ENCOUNTER — Ambulatory Visit
Admission: RE | Admit: 2022-07-11 | Discharge: 2022-07-11 | Disposition: A | Discharge status: Home / Self Care | Payer: Medicare Other | Source: Ambulatory Visit | Attending: Family Medicine | Admitting: Family Medicine

## 2022-07-11 DIAGNOSIS — Z1231 Encounter for screening mammogram for malignant neoplasm of breast: Secondary | ICD-10-CM

## 2023-02-23 ENCOUNTER — Other Ambulatory Visit: Payer: Self-pay | Admitting: Family Medicine

## 2023-02-23 DIAGNOSIS — M810 Age-related osteoporosis without current pathological fracture: Secondary | ICD-10-CM

## 2023-04-19 ENCOUNTER — Other Ambulatory Visit: Payer: Self-pay | Admitting: Acute Care

## 2023-04-19 DIAGNOSIS — F17210 Nicotine dependence, cigarettes, uncomplicated: Secondary | ICD-10-CM

## 2023-04-19 DIAGNOSIS — Z122 Encounter for screening for malignant neoplasm of respiratory organs: Secondary | ICD-10-CM

## 2023-04-19 DIAGNOSIS — Z87891 Personal history of nicotine dependence: Secondary | ICD-10-CM

## 2023-05-17 ENCOUNTER — Ambulatory Visit
Admission: RE | Admit: 2023-05-17 | Discharge: 2023-05-17 | Disposition: A | Discharge status: Home / Self Care | Payer: Medicare Other | Source: Ambulatory Visit | Attending: Acute Care | Admitting: Acute Care

## 2023-05-17 DIAGNOSIS — Z122 Encounter for screening for malignant neoplasm of respiratory organs: Secondary | ICD-10-CM

## 2023-05-17 DIAGNOSIS — Z87891 Personal history of nicotine dependence: Secondary | ICD-10-CM

## 2023-05-17 DIAGNOSIS — F17210 Nicotine dependence, cigarettes, uncomplicated: Secondary | ICD-10-CM

## 2023-05-25 ENCOUNTER — Other Ambulatory Visit: Payer: Self-pay | Admitting: Acute Care

## 2023-05-25 DIAGNOSIS — Z87891 Personal history of nicotine dependence: Secondary | ICD-10-CM

## 2023-05-25 DIAGNOSIS — Z122 Encounter for screening for malignant neoplasm of respiratory organs: Secondary | ICD-10-CM

## 2023-05-25 DIAGNOSIS — F17210 Nicotine dependence, cigarettes, uncomplicated: Secondary | ICD-10-CM

## 2023-11-14 ENCOUNTER — Other Ambulatory Visit: Payer: Self-pay | Admitting: Family Medicine

## 2023-11-14 DIAGNOSIS — Z1231 Encounter for screening mammogram for malignant neoplasm of breast: Secondary | ICD-10-CM

## 2023-11-27 ENCOUNTER — Ambulatory Visit
Admission: RE | Admit: 2023-11-27 | Discharge: 2023-11-27 | Disposition: A | Discharge status: Home / Self Care | Source: Ambulatory Visit | Attending: Family Medicine | Admitting: Family Medicine

## 2023-11-27 DIAGNOSIS — Z1231 Encounter for screening mammogram for malignant neoplasm of breast: Secondary | ICD-10-CM

## 2023-12-07 ENCOUNTER — Other Ambulatory Visit: Payer: Medicare Other

## 2024-04-03 ENCOUNTER — Ambulatory Visit: Attending: Family Medicine | Admitting: Audiologist

## 2024-04-03 DIAGNOSIS — H903 Sensorineural hearing loss, bilateral: Secondary | ICD-10-CM | POA: Insufficient documentation

## 2024-04-03 NOTE — Procedures (Signed)
  [facility] [address] [place], [place] [address] [phone]  AUDIOLOGICAL EVALUATION  Name: Kari Doyle    Status: Outpatient DOB: 01-Oct-1955    Referent: Teresa Channel, MD MRN: [id number] Date: 04/03/2024     Diagnosis: Sensorineural hearing loss, bilateral   HISTORY: Kari Doyle, 68 y.o., was seen for an audiological evaluation.   Kari Doyle notices increased difficulty hearing.  Kari Doyle notes no ear pain, pressure, or fullness.  Kari Doyle reports history of noise exposure from concerts. She notes an occasional high frequency tinnitus. There is no family history of hearing loss, or history of ear surgery or ear infections. She has history of vertigo, for which she was seen by her PCP. She reports the last episode of vertigo was 3-4 years ago.   EVALUATION: Otoscopic inspection reveals clear ear canals with visible tympanic membranes.   Tympanometry was completed to assess middle ear status. A normal, Type A tympanogram was obtained in the left ear and a hypermobile Type Ad tympanogram was obtained in the right ear.  Standard audiometric techniques were used to obtain thresholds under headphones. Speech reception thresholds are 25 dBHL on the right and 15 dBHL on the left using recorded spondee word lists. Word recognition was 92% at 65 dBHL on the right and 88% at 55 dBHL on the left using recorded NU-6 word lists, in quiet. A mild sloping to moderate high frequency sensorineural hearing loss is present bilaterally, at 1500Hz  and above.   CONCLUSION:  Kari Doyle has a mild sloping to moderate high frequency sensorineural hearing loss bilaterally.  Word recognition is good in quiet at conversational speech levels bilaterally. Findings were reviewed with the patient.   RECOMMENDATIONS: Repeat hearing evaluation as medically indicated.  Use of binaural amplification to assist in daily listening situations. A list of area hearing aid providers was given  to the patient. Use of communication strategies to improve communication in difficult listening situations. Use of hearing protection in noisy situations.  Audiogram scanned in under media tab.48 minutes spent testing and counseling.  Delon EMERSON Baumgartner, Au.D., CCC-A Audiologist 04/03/2024  cc: Teresa Channel, MD
